# Patient Record
Sex: Female | Born: 1935 | Race: White | Hispanic: No | Smoking: Former smoker
Health system: Southern US, Community
[De-identification: ages and names within clinical notes are randomized; demographics above are authoritative.]

## PROBLEM LIST (undated history)

## (undated) DIAGNOSIS — F0390 Unspecified dementia without behavioral disturbance: Secondary | ICD-10-CM

## (undated) DIAGNOSIS — H3530 Unspecified macular degeneration: Secondary | ICD-10-CM

## (undated) DIAGNOSIS — E0590 Thyrotoxicosis, unspecified without thyrotoxic crisis or storm: Secondary | ICD-10-CM

## (undated) DIAGNOSIS — M1990 Unspecified osteoarthritis, unspecified site: Secondary | ICD-10-CM

## (undated) DIAGNOSIS — I10 Essential (primary) hypertension: Secondary | ICD-10-CM

## (undated) DIAGNOSIS — I639 Cerebral infarction, unspecified: Secondary | ICD-10-CM

## (undated) HISTORY — PX: APPENDECTOMY: SHX54

## (undated) HISTORY — PX: TONSILLECTOMY: SUR1361

---

## 2001-12-29 ENCOUNTER — Other Ambulatory Visit: Admission: RE | Admit: 2001-12-29 | Discharge: 2001-12-29 | Payer: Self-pay | Admitting: Dermatology

## 2004-05-07 ENCOUNTER — Inpatient Hospital Stay (HOSPITAL_COMMUNITY): Admission: EM | Admit: 2004-05-07 | Discharge: 2004-05-09 | Payer: Self-pay | Admitting: Emergency Medicine

## 2004-06-04 ENCOUNTER — Encounter (HOSPITAL_COMMUNITY): Admission: RE | Admit: 2004-06-04 | Discharge: 2004-06-05 | Payer: Self-pay | Admitting: Family Medicine

## 2005-11-19 ENCOUNTER — Ambulatory Visit (HOSPITAL_COMMUNITY): Admission: RE | Admit: 2005-11-19 | Discharge: 2005-11-19 | Payer: Self-pay | Admitting: Family Medicine

## 2006-06-25 ENCOUNTER — Ambulatory Visit (HOSPITAL_COMMUNITY): Admission: RE | Admit: 2006-06-25 | Discharge: 2006-06-25 | Payer: Self-pay | Admitting: Family Medicine

## 2006-08-04 IMAGING — CR DG CHEST 2V
2 series · 2 of 2 positions shown · non-contrast
Comparison: none

CLINICAL DATA: 87-year-old, acute CVA with dysphasia. 
 CHEST - TWO VIEW:
 Two views of the chest without prior study for comparison demonstrate the cardiac silhouette to be borderline enlarged. There is marked tortuosity and ectasia of the thoracic aorta.  There are probable chronic interstitial lung changes but no definite effusions, edema, or infiltrates.  Osteoporotic changes are noted in the spine. There are biapical pleural and parenchymal scaring-type changes, left greater than right.

[view not recorded (1 of 2)]
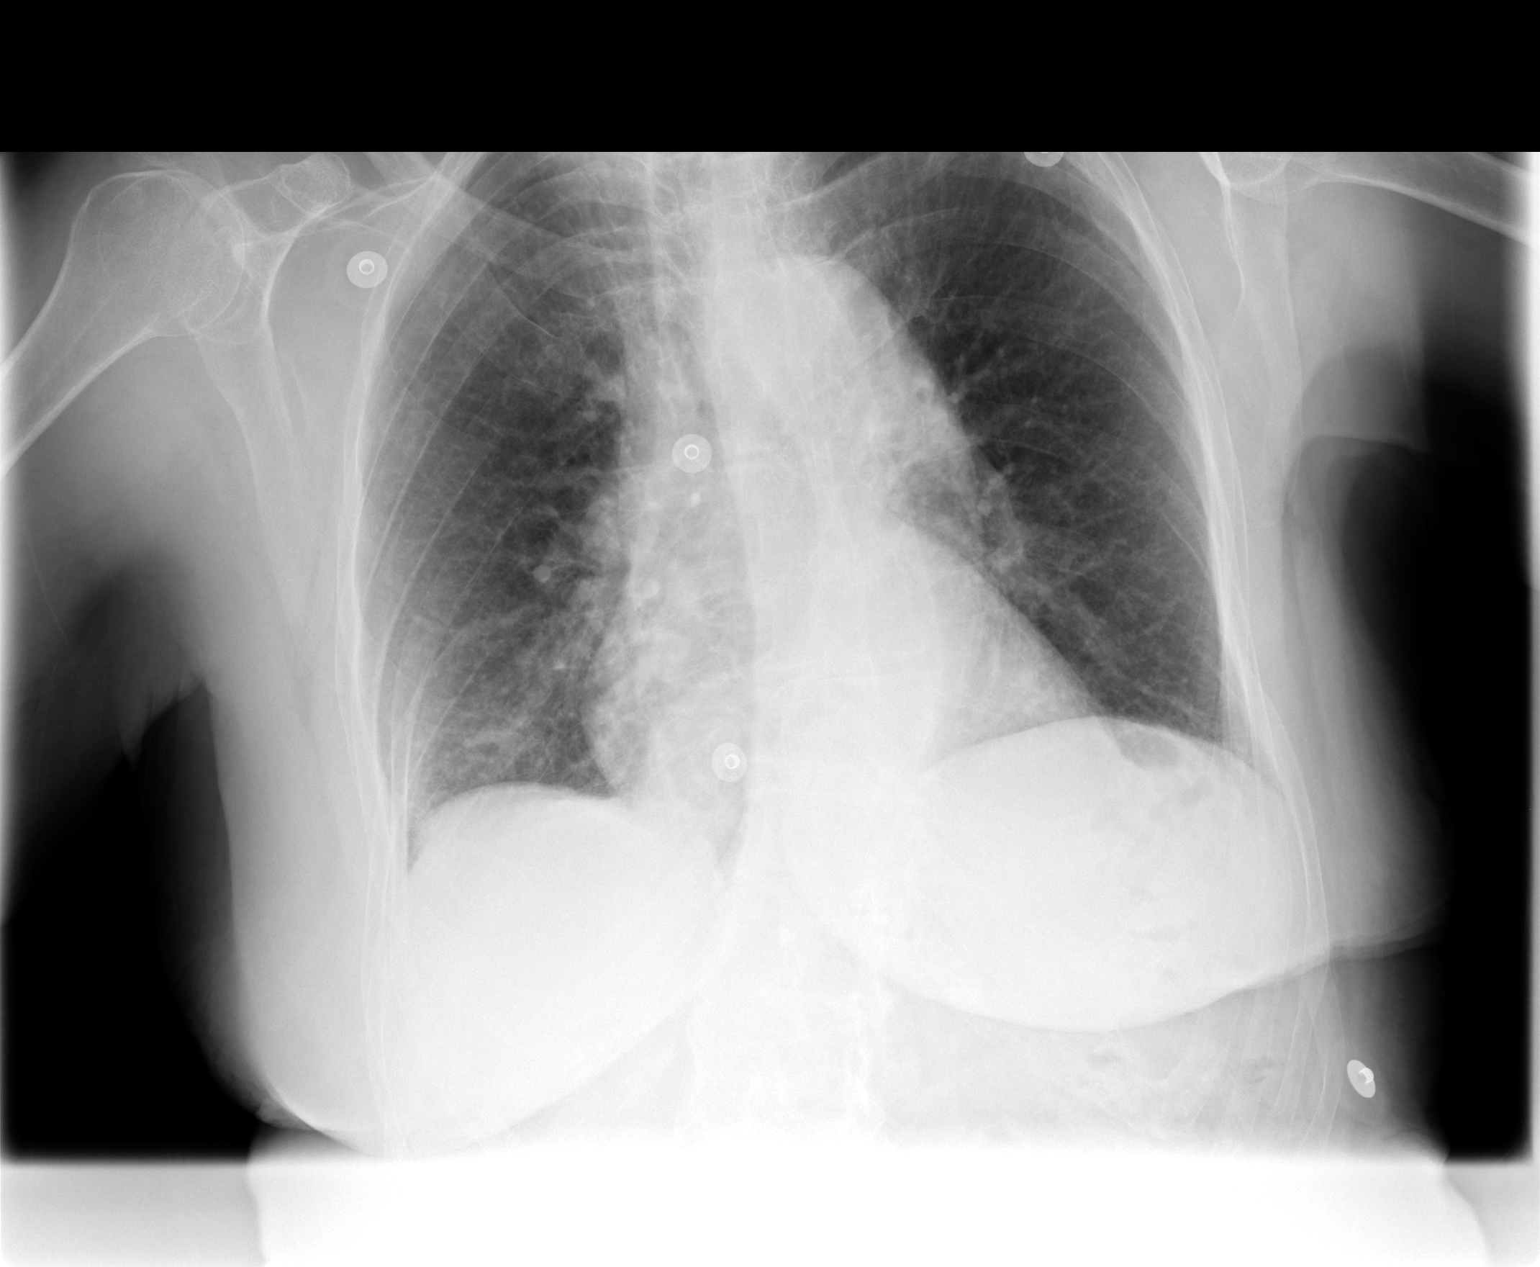

[view not recorded (2 of 2)]
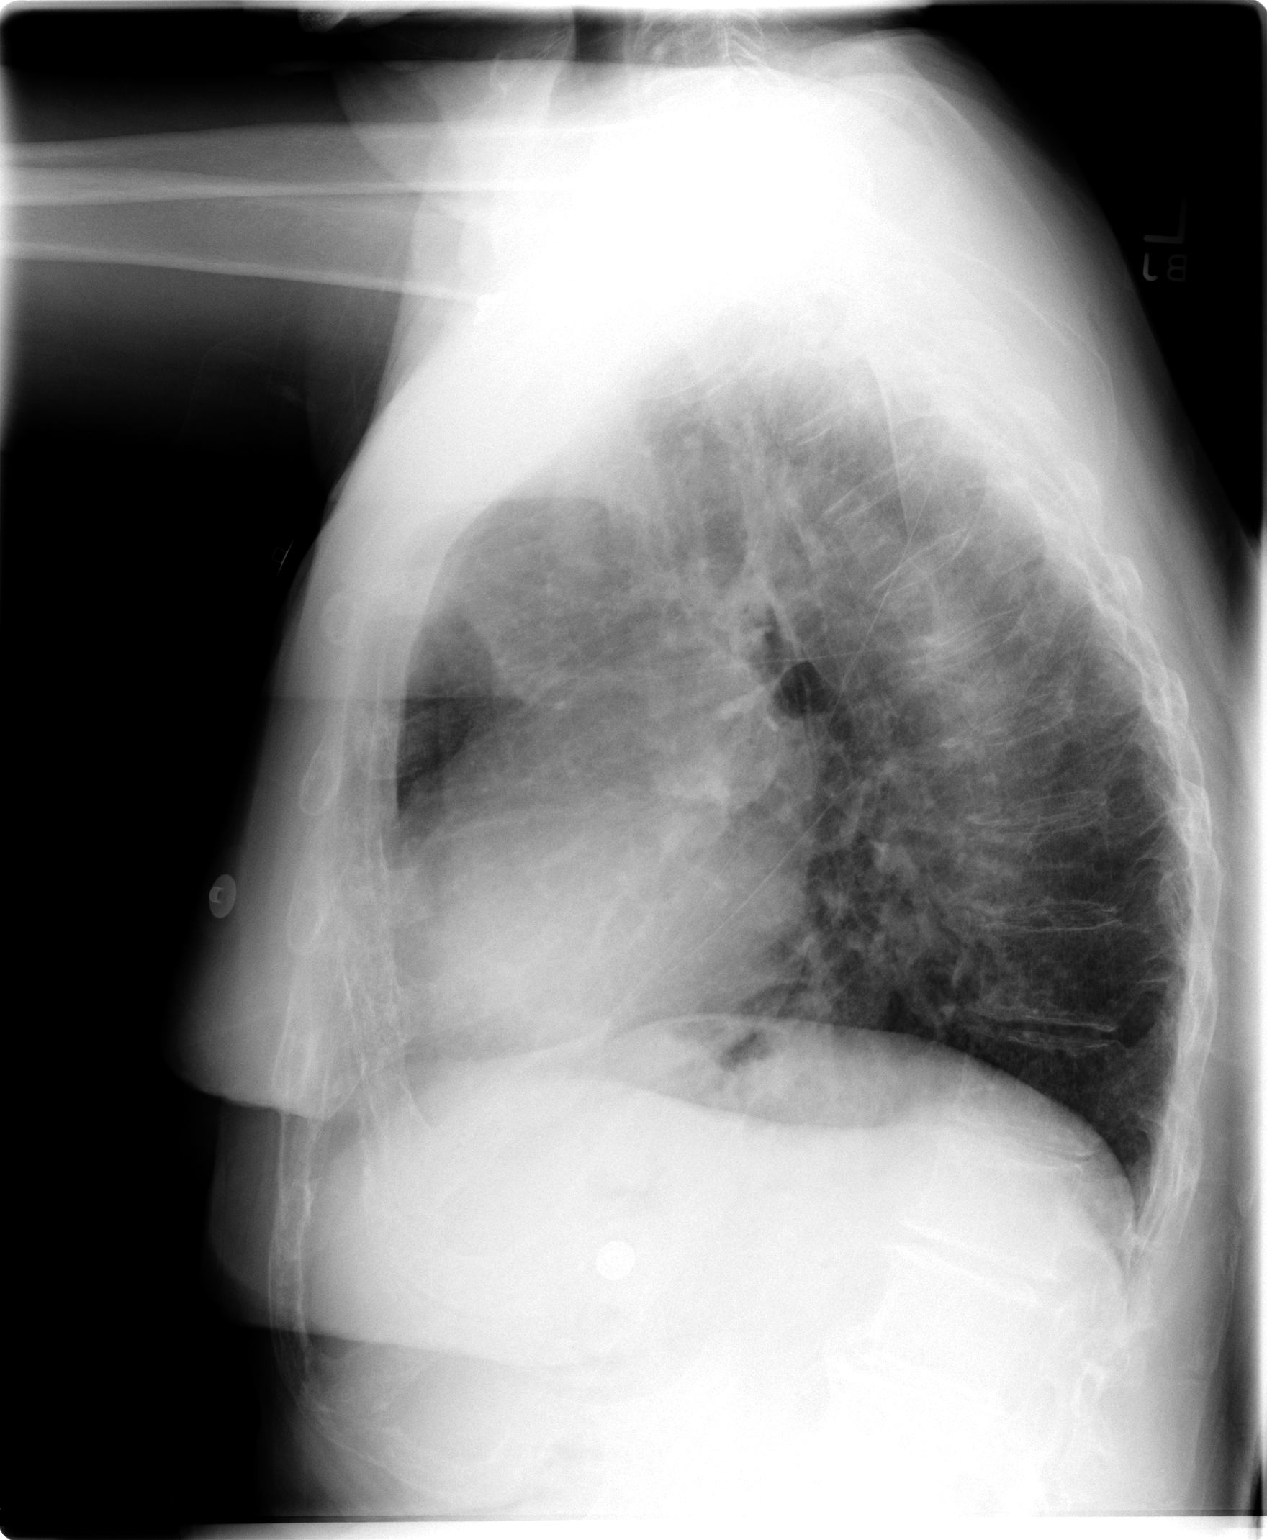

[2 of 2 positions shown; findings below may reference images not displayed]

IMPRESSION: 1.  Probable chronic lung changes.  Recommend correlation with any prior chest films.
 2.  Tortuous ectatic thoracic aorta.

## 2006-08-16 ENCOUNTER — Ambulatory Visit: Payer: Self-pay | Admitting: Family Medicine

## 2006-08-16 DIAGNOSIS — I10 Essential (primary) hypertension: Secondary | ICD-10-CM | POA: Insufficient documentation

## 2006-08-16 DIAGNOSIS — H3530 Unspecified macular degeneration: Secondary | ICD-10-CM | POA: Insufficient documentation

## 2006-08-16 DIAGNOSIS — H409 Unspecified glaucoma: Secondary | ICD-10-CM | POA: Insufficient documentation

## 2006-08-16 DIAGNOSIS — E785 Hyperlipidemia, unspecified: Secondary | ICD-10-CM

## 2006-08-16 DIAGNOSIS — Z8679 Personal history of other diseases of the circulatory system: Secondary | ICD-10-CM | POA: Insufficient documentation

## 2006-08-16 DIAGNOSIS — M1990 Unspecified osteoarthritis, unspecified site: Secondary | ICD-10-CM | POA: Insufficient documentation

## 2006-08-16 DIAGNOSIS — R443 Hallucinations, unspecified: Secondary | ICD-10-CM

## 2006-08-17 ENCOUNTER — Encounter (INDEPENDENT_AMBULATORY_CARE_PROVIDER_SITE_OTHER): Payer: Self-pay | Admitting: Family Medicine

## 2006-08-19 ENCOUNTER — Encounter (INDEPENDENT_AMBULATORY_CARE_PROVIDER_SITE_OTHER): Payer: Self-pay | Admitting: Family Medicine

## 2006-08-20 ENCOUNTER — Encounter (INDEPENDENT_AMBULATORY_CARE_PROVIDER_SITE_OTHER): Payer: Self-pay | Admitting: Family Medicine

## 2006-08-20 ENCOUNTER — Ambulatory Visit (HOSPITAL_COMMUNITY): Admission: RE | Admit: 2006-08-20 | Discharge: 2006-08-20 | Payer: Self-pay | Admitting: Family Medicine

## 2006-08-31 ENCOUNTER — Ambulatory Visit: Payer: Self-pay | Admitting: Family Medicine

## 2006-09-02 ENCOUNTER — Encounter (INDEPENDENT_AMBULATORY_CARE_PROVIDER_SITE_OTHER): Payer: Self-pay | Admitting: Family Medicine

## 2006-09-09 ENCOUNTER — Ambulatory Visit (HOSPITAL_COMMUNITY): Admission: RE | Admit: 2006-09-09 | Discharge: 2006-09-09 | Payer: Self-pay | Admitting: Family Medicine

## 2008-06-07 ENCOUNTER — Ambulatory Visit (HOSPITAL_COMMUNITY): Payer: Self-pay | Admitting: Family Medicine

## 2008-06-07 ENCOUNTER — Encounter (HOSPITAL_COMMUNITY): Admission: RE | Admit: 2008-06-07 | Discharge: 2008-06-20 | Payer: Self-pay | Admitting: Oncology

## 2008-08-13 ENCOUNTER — Emergency Department (HOSPITAL_COMMUNITY): Admission: EM | Admit: 2008-08-13 | Discharge: 2008-08-13 | Payer: Self-pay | Admitting: Emergency Medicine

## 2008-10-11 ENCOUNTER — Ambulatory Visit (HOSPITAL_COMMUNITY): Payer: Self-pay | Admitting: Family Medicine

## 2008-10-11 ENCOUNTER — Encounter (HOSPITAL_COMMUNITY): Admission: RE | Admit: 2008-10-11 | Discharge: 2008-11-10 | Payer: Self-pay | Admitting: Family Medicine

## 2010-01-22 ENCOUNTER — Inpatient Hospital Stay (HOSPITAL_COMMUNITY): Admission: EM | Admit: 2010-01-22 | Discharge: 2010-01-24 | Payer: Self-pay | Admitting: Emergency Medicine

## 2010-01-22 ENCOUNTER — Ambulatory Visit: Payer: Self-pay | Admitting: Orthopedic Surgery

## 2010-01-23 ENCOUNTER — Encounter: Payer: Self-pay | Admitting: Orthopedic Surgery

## 2010-01-27 ENCOUNTER — Telehealth: Payer: Self-pay | Admitting: Orthopedic Surgery

## 2010-01-27 ENCOUNTER — Encounter: Payer: Self-pay | Admitting: Orthopedic Surgery

## 2010-01-30 ENCOUNTER — Ambulatory Visit: Payer: Self-pay | Admitting: Orthopedic Surgery

## 2010-01-30 ENCOUNTER — Ambulatory Visit (HOSPITAL_COMMUNITY): Admission: RE | Admit: 2010-01-30 | Discharge: 2010-01-30 | Payer: Self-pay | Admitting: Orthopedic Surgery

## 2010-02-04 ENCOUNTER — Telehealth: Payer: Self-pay | Admitting: Orthopedic Surgery

## 2010-02-04 ENCOUNTER — Encounter: Payer: Self-pay | Admitting: Orthopedic Surgery

## 2010-02-06 ENCOUNTER — Ambulatory Visit: Payer: Self-pay | Admitting: Orthopedic Surgery

## 2010-02-06 ENCOUNTER — Ambulatory Visit (HOSPITAL_COMMUNITY): Admission: RE | Admit: 2010-02-06 | Discharge: 2010-02-06 | Payer: Self-pay | Admitting: Orthopedic Surgery

## 2010-02-07 ENCOUNTER — Inpatient Hospital Stay (HOSPITAL_COMMUNITY): Admission: RE | Admit: 2010-02-07 | Discharge: 2010-02-12 | Payer: Self-pay | Admitting: Orthopedic Surgery

## 2010-02-07 ENCOUNTER — Ambulatory Visit: Payer: Self-pay | Admitting: Orthopedic Surgery

## 2010-02-08 ENCOUNTER — Encounter: Payer: Self-pay | Admitting: Orthopedic Surgery

## 2010-02-11 ENCOUNTER — Ambulatory Visit: Payer: Self-pay | Admitting: Orthopedic Surgery

## 2010-02-20 ENCOUNTER — Ambulatory Visit: Payer: Self-pay | Admitting: Orthopedic Surgery

## 2010-02-20 ENCOUNTER — Ambulatory Visit (HOSPITAL_COMMUNITY): Admission: RE | Admit: 2010-02-20 | Discharge: 2010-02-20 | Payer: Self-pay | Admitting: Orthopedic Surgery

## 2010-03-18 ENCOUNTER — Encounter: Payer: Self-pay | Admitting: Orthopedic Surgery

## 2010-03-20 ENCOUNTER — Ambulatory Visit (HOSPITAL_COMMUNITY): Admission: RE | Admit: 2010-03-20 | Discharge: 2010-03-20 | Payer: Self-pay | Admitting: Orthopedic Surgery

## 2010-03-20 ENCOUNTER — Ambulatory Visit: Payer: Self-pay | Admitting: Orthopedic Surgery

## 2010-03-20 DIAGNOSIS — S82843A Displaced bimalleolar fracture of unspecified lower leg, initial encounter for closed fracture: Secondary | ICD-10-CM

## 2010-04-10 ENCOUNTER — Ambulatory Visit: Payer: Self-pay | Admitting: Orthopedic Surgery

## 2010-05-08 ENCOUNTER — Ambulatory Visit: Payer: Self-pay | Admitting: Orthopedic Surgery

## 2010-06-25 ENCOUNTER — Ambulatory Visit
Admission: RE | Admit: 2010-06-25 | Discharge: 2010-06-25 | Payer: Self-pay | Source: Home / Self Care | Attending: Orthopedic Surgery | Admitting: Orthopedic Surgery

## 2010-07-12 ENCOUNTER — Encounter: Payer: Self-pay | Admitting: Family Medicine

## 2010-07-22 NOTE — Letter (Signed)
Summary: Xray order  Xray order   Imported By: Cammie Sickle 03/18/2010 10:31:58  _____________________________________________________________________  External Attachment:    Type:   Image     Comment:   External Document

## 2010-07-22 NOTE — Progress Notes (Signed)
Summary: call from nursing facility/Post op 1 appt request@Short  Stay  Phone Note Other Incoming   Caller: nursing facility Summary of Call: Britta Mccreedy Ridelin from [facility] in [place], [place] [phone] called to schedule for Thursday, 01/30/10, per Dr Romeo Apple, for post op dressing change and cast or splint, to be seen at [facility]. Please advise of time, prior to or following clinic Initial call taken by: Cammie Sickle,  January 27, 2010 9:51 AM

## 2010-07-22 NOTE — Letter (Signed)
Summary: Short Stay physician's orders  Short Stay physician's orders   Imported By: Jacklynn Ganong 01/30/2010 08:45:57  _____________________________________________________________________  External Attachment:    Type:   Image     Comment:   External Document

## 2010-07-22 NOTE — Consult Note (Signed)
Summary: [facility]   Imported By: Cammie Sickle 01/27/2010 09:21:26  _____________________________________________________________________  External Attachment:    Type:   Image     Comment:   External Document

## 2010-07-22 NOTE — Progress Notes (Signed)
Summary: when to see Riccardo Dubin  Phone Note Other Incoming   Summary of Call: Steward Drone, the administrator at [facility] called asking if you are going to see Trena Dunavan at the hospital this coming Friday (02/07/10) and if so what time. Steward Drone can be reached at [phone] Initial call taken by: Jacklynn Ganong,  February 04, 2010 3:02 PM

## 2010-07-22 NOTE — Assessment & Plan Note (Signed)
Summary: HOSP FOL/UP/FX ANKLE/XRAY PRIOR TO [facility]PT,[facility]/POST OP/MEDICARE/CAF   Visit Type:  Follow-up Primary Provider:  Franchot Heidelberg, MD  CC:  post op right ankle.  History of Present Illness:   DOS 01/24/10 short leg splint, evacuation of subcutaneous blisters right ankle fracture,   then OTIF 02/07/10  Today is recheck.  Xrays were taken [facility] 03/20/10 of the right ankle in cast.  She is only able to maintain weightbearing restriction which is no weight-bearing part of the time she does play some weight on her heel  She has an external rotation deformity chronic  X-rays done at the hospital.  Overall alignment acceptable.  Fracture slight gapping noted on extreme oblique view  Things seem to be holding up well  Skin is much improved with no blisters all incisions healed  Recommend continue nonweightbearing in a short leg cast and come back in 3 weeks for x-rays out of plaster here.  Allergies: 1)  ! Codeine   Impression & Recommendations:  Problem # 1:  AFTERCARE HEALING TRAUMATIC FRACTURE OTHER BONE (ICD-V54.19)  Orders: Post-Op Check (16109)  Problem # 2:  CLOSED BIMALLEOLAR FRACTURE (ICD-824.4)  Orders: Post-Op Check (60454)  Patient Instructions: 1)  Please schedule a follow-up appointment in 3 weeks. 2)  xrays OOP HERE

## 2010-07-22 NOTE — Letter (Signed)
Summary: Short stay physician's orders  Short stay physician's orders   Imported By: Jacklynn Ganong 02/04/2010 16:51:25  _____________________________________________________________________  External Attachment:    Type:   Image     Comment:   External Document

## 2010-07-22 NOTE — Progress Notes (Signed)
Summary: application of splint at [facility] 02/06/10 12pm  Phone Note Outgoing Call   Summary of Call: called and let facility know that pt needs to be at [facility] this Thursday the 18th at 1130am for splint application, skin check.   Initial call taken by: Ether Griffins,  February 04, 2010 4:24 PM

## 2010-07-22 NOTE — Letter (Signed)
Summary: Patient medication discharge instrauctions  Patient medication discharge instrauctions   Imported By: Jacklynn Ganong 02/13/2010 15:21:18  _____________________________________________________________________  External Attachment:    Type:   Image     Comment:   External Document

## 2010-07-22 NOTE — Progress Notes (Signed)
Summary: nursing facility called about drainage   Phone Note Call from Patient   Summary of Call: Britta Mccreedy from [facility] [phone]) called back  to relay that therapist from [facility] came to see patient and notes that surgical site has drainage on both sides of ankle -- said looks dry on LT side and fresh on RT side of ankle.  Please advise if home health nurse to be orderedr for wound care?  States due to be seen by Dr Danella Penton 01/30/10.  If home nurse needed, they use [facility], [place] ph [phone] Initial call taken by: Cammie Sickle,  January 27, 2010 2:36 PM  Follow-up for Phone Call        no wound care  Follow-up by: Fuller Canada MD,  January 27, 2010 2:37 PM  Additional Follow-up for Phone Call Additional follow up Details #1::        Advised, phone call completed Additional Follow-up by: Cammie Sickle,  January 27, 2010 3:03 PM

## 2010-07-22 NOTE — Letter (Signed)
Summary: Hospital progress note  Hospital progress note   Imported By: Cammie Sickle 02/25/2010 10:54:17  _____________________________________________________________________  External Attachment:    Type:   Image     Comment:   External Document

## 2010-07-22 NOTE — Assessment & Plan Note (Signed)
Summary: 1 M RE-CK/XRAYS RT ANKLE/POST OP/MEDICARE/CAF   Visit Type:  Follow-up Primary Provider:  Franchot Heidelberg, MD  CC:  fx care post op.  History of Present Illness: I saw Gabrielle Goodwin in the office today for a 3 week  followup visit.  She is a 77 years old woman with the complaint of:  right ankle fracture  DOS 01/24/10 short leg splint, evacuation of subcutaneous blisters right ankle fracture   OTIF 02/07/10  Today is recheck with xrays OOP.  No pain med needed.  Wound to finally healed.  Her ankle is in neutral position in terms of the joint.  She has no tenderness. No deformity. No pain no swelling.  X-rays 3 views of the RIGHT ankle, show that the fracture has maintained its wound and the hardware is intact. There was a separation of the fracture at the medial malleolus. This persisted throughout treatment. No change of position. Mortise is intact.   Aircast for 6 weeks repeat x-rays  Allergies: 1)  ! Codeine   Impression & Recommendations:  Problem # 1:  CLOSED BIMALLEOLAR FRACTURE (ICD-824.4) Assessment Improved  Orders: Post-Op Check (17616) Ankle x-ray complete,  minimum 3 views (07371)  Problem # 2:  AFTERCARE HEALING TRAUMATIC FRACTURE OTHER BONE (ICD-V54.19) Assessment: Improved  Orders: Post-Op Check (06269) Ankle x-ray complete,  minimum 3 views (48546)  Patient Instructions: 1)  x-rays in 6 weeks   Orders Added: 1)  Post-Op Check [99024] 2)  Ankle x-ray complete,  minimum 3 views [27035]

## 2010-07-22 NOTE — Assessment & Plan Note (Signed)
Summary: 3 WK RE-CK/XRAYS OOP RT ANKLE/POST OP/MEDICARE/CAF   Visit Type:  Follow-up Primary Provider:  Franchot Heidelberg, MD  CC:  right ankle fracture.  History of Present Illness: I saw Gabrielle Goodwin in the office today for a 3 week  followup visit.  She is a 77 years old woman with the complaint of:  right ankle fracture  DOS 01/24/10 short leg splint, evacuation of subcutaneous blisters right ankle fracture,   then OTIF 02/07/10  Today is recheck with xrays OOP.  No pain.  Doing well, in wheelchair today  Exam the patient has been somewhat noncompliant with the nonweightbearing status but she comes in today with skin intact.  Cast is changed.  X-rays show that there is some gapping at the fracture site but the hardware is intact the overall mortise is acceptable  Impression healing ankle fracture with some fracture gapping but overall acceptable position  Recommend cast for another 4 weeks and x-ray out of plaster  Allergies: 1)  ! Codeine   Other Orders: Post-Op Check (53664) Ankle x-ray complete,  minimum 3 views (40347)   Orders Added: 1)  Post-Op Check [99024] 2)  Ankle x-ray complete,  minimum 3 views [42595]

## 2010-07-22 NOTE — Progress Notes (Signed)
Summary: Call to [facility]  ---- Converted from flag ---- ---- 01/27/2010 4:11 PM, Fuller Canada MD wrote: call them back  cast application thurs 8-11  be at hospital at 8 am ------------------------------  Phone Note Outgoing Call   Summary of Call: I called Britta Mccreedy at nursing facility [phone] & advised per above.

## 2010-07-24 NOTE — Assessment & Plan Note (Signed)
Summary: 6 WK RE-CK/XRAY RT ANKLE /POST OP/MEDICARE/[facility]   Visit Type:  Follow-up Primary Provider:     CC:  right ankle fracture.  History of Present Illness: I saw Gabrielle Goodwin in the office today for a followup visit.  She is a 77 years old woman with the complaint of:    8-5-2011DOS   Xrays today.  ZO:XWRUE ankle fracture  Treatment:OTIF   Today, scheduled AVW:UJWJX ANKLE   3 views of the RIGHT ankle. There is internal fixation, medial and lateral mortise appears to be intact. Fractures appear to be healed. Alignment looks normal.  Impression status post open treatment internal fixation, RIGHT ankle   Allergies: 1)  ! Codeine   Impression & Recommendations:  Problem # 1:  CLOSED BIMALLEOLAR FRACTURE (ICD-824.4)  Orders: Est. Patient Level II (91478) Ankle x-ray complete,  minimum 3 views (29562)  Problem # 2:  AFTERCARE HEALING TRAUMATIC FRACTURE OTHER BONE (ICD-V54.19)  Orders: Est. Patient Level II (13086) Ankle x-ray complete,  minimum 3 views (57846)  Patient Instructions: 1)  Please schedule a follow-up appointment as needed.   Orders Added: 1)  Est. Patient Level II [96295] 2)  Ankle x-ray complete,  minimum 3 views [73610]

## 2010-09-04 LAB — GLUCOSE, CAPILLARY: Glucose-Capillary: 222 mg/dL — ABNORMAL HIGH (ref 70–99)

## 2010-09-05 LAB — DIFFERENTIAL
Basophils Absolute: 0 10*3/uL (ref 0.0–0.1)
Basophils Relative: 0 % (ref 0–1)
Eosinophils Absolute: 0.1 10*3/uL (ref 0.0–0.7)
Eosinophils Relative: 1 % (ref 0–5)
Monocytes Absolute: 0.4 10*3/uL (ref 0.1–1.0)
Monocytes Relative: 6 % (ref 3–12)
Neutro Abs: 5.8 10*3/uL (ref 1.7–7.7)

## 2010-09-05 LAB — BASIC METABOLIC PANEL
BUN: 19 mg/dL (ref 6–23)
CO2: 26 mEq/L (ref 19–32)
GFR calc non Af Amer: 38 mL/min — ABNORMAL LOW (ref >60)
Glucose, Bld: 121 mg/dL — ABNORMAL HIGH (ref 70–99)
Potassium: 4.1 mEq/L (ref 3.5–5.1)

## 2010-09-05 LAB — POCT I-STAT, CHEM 8
Calcium, Ion: 0.99 mmol/L — ABNORMAL LOW (ref 1.12–1.32)
Chloride: 109 mEq/L (ref 96–112)
Creatinine, Ser: 1.4 mg/dL — ABNORMAL HIGH (ref 0.4–1.2)
Glucose, Bld: 145 mg/dL — ABNORMAL HIGH (ref 70–99)
HCT: 39 % (ref 36.0–46.0)

## 2010-09-05 LAB — CBC
HCT: 33.5 % — ABNORMAL LOW (ref 36.0–46.0)
Hemoglobin: 11.3 g/dL — ABNORMAL LOW (ref 12.0–15.0)
MCH: 31.4 pg (ref 26.0–34.0)
MCHC: 33.7 g/dL (ref 30.0–36.0)
MCV: 93.2 fL (ref 78.0–100.0)
RDW: 13.7 % (ref 11.5–15.5)

## 2010-09-22 ENCOUNTER — Other Ambulatory Visit: Payer: Self-pay | Admitting: Family Medicine

## 2010-09-29 ENCOUNTER — Other Ambulatory Visit: Payer: Self-pay | Admitting: Family Medicine

## 2010-10-01 LAB — CROSSMATCH
ABO/RH(D): O POS
Antibody Screen: NEGATIVE

## 2010-11-07 NOTE — H&P (Signed)
Gabrielle Goodwin, Gabrielle Goodwin               ACCOUNT NO.:  [id number]   MEDICAL RECORD NO.:  [id number]          PATIENT TYPE:  INP   LOCATION:  A214                          FACILITY:  [facility]   PHYSICIAN:  Kofi A. Gerilyn Pilgrim, M.D. DATE OF BIRTH:  [birth year]   DATE OF ADMISSION:  05/07/2004  DATE OF DISCHARGE:  LH                                HISTORY & PHYSICAL   ASSESSMENT/PLAN:  Acute cerebrovascular event, likely in a subcortical area.  She currently is on deep vein thrombosis prophylaxis and aspirin and anti-  platelet agents. We believe that these regimens are appropriate. She also  has imaging of the brain with MRI and MRA pending. She also has  echocardiography and Doppler's ordered. Pending these results, will be  followed up.   HISTORY OF PRESENT ILLNESS:  An elderly 77 year old lady who apparently has  a baseline history of hypertension. She was noted to have relatively acute  onset of slurred speech. This was noted by her daughter on the phone who was  talking to her at approximately 2:00 o'clock today. When EMS arrived, they  apparently noted some left-sided weakness, although this is somewhat  disputable. The patient was seen in the emergency room and was found to have  elevated blood pressure of 260/64. She apparently has a blood pressure that  is easily controlled in 120's over 80. No headaches or chest pain are  reported. No shortness of breath, fevers.   PAST MEDICAL HISTORY:  Macular degeneration, basal cell carcinoma of the  face and neck, hypertension, osteoarthritis.   MEDICATIONS:  Advil.   ALLERGIES:  None known.   SOCIAL HISTORY:  She lives alone. She is a retired Scientist, product/process development. She quit  tobacco 20 years ago. Previous to that, she used it for a long time. No  reports of tobacco or illicit drug use.   FAMILY HISTORY:  Significant for myocardial infarction and leukemia.   REVIEW OF SYSTEMS:  Listed in the history of present illness. All others  unremarkable.  There are several reports of left knee pain.   PHYSICAL EXAMINATION:  GENERAL:  Examination shows a thin, pleasant lady in  no acute distress. She is somewhat agitated about being in the bed and not  being positioned properly. We did get the nurse and get that taken care of.  VITAL SIGNS:  Temperature 97.1, blood pressure 116/74, pulse in the 70's,  respiratory rate 20.  HEENT:  Unremarkable evaluation.  NECK:  Supple.  LUNGS:  Clear to auscultation bilaterally.  CARDIOVASCULAR:  Normal S1 and S2.  ABDOMEN:  Soft.  EXTREMITIES:  No edema.  NEUROLOGIC:  She is awake and alert. She was initially somewhat stunned on  the initial part of the encounter, but she was easily oriented and  appropriate. She does have a clear mild dysarthria. Naming is unremarkable.  Comprehension fluency and repetition are intact. Cranial nerves 2-12 are  intact including visual fields. Motor examination shows normal tone, bulk  and strength. There is no pronator drift. Coordination is intact. Reflexes  are +2. Plantar reflexes are upgoing on the right and downgoing on  the left.  Sensory examination normal to temperature and light touch.   LABORATORY DATA:  Electrocardiogram shows normal sinus rhythm.   Head CT is negative for any acute process.     Kofi   KAD/MEDQ  D:  05/07/2004  T:  05/07/2004  Job:  [id number]

## 2010-11-07 NOTE — Procedures (Signed)
Gabrielle Goodwin, Gabrielle Goodwin               ACCOUNT NO.:  [id number]   MEDICAL RECORD NO.:  [id number]          PATIENT TYPE:  INP   LOCATION:  A214                          FACILITY:  [facility]   PHYSICIAN:  Dani Gobble, MD       DATE OF BIRTH:  Oct 12, 1916   DATE OF PROCEDURE:  1August 31, 202005  DATE OF DISCHARGE:                                  ECHOCARDIOGRAM   REFERRING PHYSICIAN:  Dr. Vania Rea.   INDICATIONS:  Gabrielle Goodwin is an 77 year old female with a stroke who was  found to have a murmur.   TECHNICAL QUALITY:  The technical quality of the study is somewhat limited  secondary to patient body habitus and poor acoustic windows.   FINDINGS:  1.  The aorta is within normal limits and 3.1 cm.  2.  The left atrium is moderately dilated at 4.9 cm.  The patient appeared      to be in sinus rhythm during the procedure.  No obvious clots or masses      were appreciated on this study, although it would be somewhat difficult      to discern a small mass or clot given the technical limitations on this      study.  3.  The intraventricular septum and posterior wall are both mild-to-      moderately thickened at 1.5 cm for each.  4.  The aortic valve was not well visualized, but is probably a trileaflet      valve.  There was mild thickening and calcification on the leaflets.      Opening leaflet excursion is reasonable.  There is mild aortic      insufficiency noted.  Doppler interrogation of the aortic valve reveals      a peak velocity of 1.6 meters per second, corresponding to a peak      gradient of 11 mmHg and a mean gradient of 7 mmHg.  5.  The mitral valve is also mildly thickened, but with normal leaflet      excursion.  No mitral valve prolapse is noted.  Mild, mitral annular      calcification is noted.  Mild mitral regurgitation is noted.  Doppler      interrogation of the mitral valve is within normal limits.  6.  The pulmonic valve is not well visualized.  7.  The tricuspid valve  appears grossly structurally normal with mild      tricuspid regurgitation noted.  Pulmonary pressures are estimated at      approximately 35 mmHg representing mild pulmonary hypertension.  8.  The left ventricle appears grossly normal in size.  Left ventricular      systolic function appears normal.  No obvious regional wall motion      abnormalities are noted.  The presence of diastolic dysfunction is      inferred from pulse wave Doppler across the mitral valve.  The right      ventricle appears normal in size with regional right ventricular      systolic pressure.  The right atrium is mild to moderately dilated.  There is a Chiari network noted in the right atrium (normal variant).   IMPRESSION:  1.  Mild to moderate biatrial enlargement.  2.  Mild to moderate concentric left ventricular hypertrophy.  3.  Mild aortic sclerosis without stenosis.  4.  Mild mitral annular calcification.  5.  Mild mitral and tricuspid regurgitation.  6.  Mild aortic insufficiency.  7.  Normal left ventricular size and systolic function without regional wall      motion abnormality noted.  8.  Chiari network is noted within the right atrium which is a normal      variant.  9.  The presence of diastolic dysfunction is inferred from pulse wave      Doppler across the mitral valve.      AB/MEDQ  D:  109/06/2003  T:  05/09/2004  Job:  [id number]   cc:   Vania Rea, M.D.

## 2010-11-07 NOTE — Group Therapy Note (Signed)
NAMEXIANNA, SIVERLING               ACCOUNT NO.:  [id number]   MEDICAL RECORD NO.:  [id number]          PATIENT TYPE:  INP   LOCATION:  A214                          FACILITY:  [facility]   PHYSICIAN:  Kofi A. Gerilyn Pilgrim, M.D. DATE OF BIRTH:  09/16/1916   DATE OF PROCEDURE:  05/09/2004  DATE OF DISCHARGE:                                   PROGRESS NOTE   The family of the patient reports that she has had episodic pulling of the  face towards the left.  Examination on admission was unremarkable other than  upgoing toe on the right. Today's examination shows a clear flattening of  the nasolabial fold on the right.  Her speech continues to be dysarthric as  it was on admission.  The patient complains of weakness on the right side,  though no clear weakness was elicited.   Her workup has been reviewed.  She has low B12 of 111.  Homocysteine level  was high at 21.   Echocardiography shows concentric left ventricular hypertrophy but normal  ejection size and function.  The current duplex Doppler shows atheroma  involving the left internal carotid artery with percent of stenosis probably  just over 50% with a velocity of 146.   The MRA was reviewed extensively.  She has evidence of multiple small acute  infarcts seen on diffusion-weighted sequences, cortically based lesions  involving the left posterior MCA distribution, essentially the left parietal  temporal region.  She also has some chronic ischemic paraventricular white  matter lesions seen bilaterally.   The MRA of intracranial vessels shows unremarkable posterior circulation.  The anterior circulation shows about a 50% stenosis involving the M1  distribution of the right MCA.  The left MCA shows some moderate  athosclerotic disease diffusely distally.   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   I do not believe her lesions warrant any  surgical intervention.   I agree with the current plan which is to maximize medical treatment.  The  patient was not on any form of treatment plan before.  She currently has  been placed on aspirin, Lipitor, blood pressure control, and aggressive B12  replacement.  She will follow up with [place] in about two weeks and with her  primary care physician.     Kofi   KAD/MEDQ  D:  05/09/2004  T:  05/09/2004  Job:  [id number]

## 2010-11-07 NOTE — Discharge Summary (Signed)
Gabrielle Goodwin, BRISSETT NO.:  [id number]   MEDICAL RECORD NO.:  [id number]          PATIENT TYPE:  INP   LOCATION:  A214                          FACILITY:  [facility]   PHYSICIAN:  Vania Rea, M.D. DATE OF BIRTH:  [birth year]   DATE OF ADMISSION:  05/07/2004  DATE OF DISCHARGE:  11/18/2005LH                                 DISCHARGE SUMMARY   PRIMARY CARE PHYSICIAN:  Unassigned, now assigned to Dr. Fara Chute in  [place].   DISCHARGE DIAGNOSES:  1.  Acute cerebrovascular accident.  2.  Hypertension, improved control.  3.  B-12 deficiency.  4.  Abnormal thyroid function test.  5.  Elevated alkaline phosphatase of unclear etiology.   DISPOSITION:  Discharged to live with her daughter.   DISCHARGE CONDITION:  Stable.   DISCHARGE MEDICATIONS:  1.  Lopressor 50 mg twice daily.  2.  Avapro 150 mg twice daily.  3.  Aspirin 325 mg daily.  4.  Cyanacobalamine  1,000 mcg intramuscularly weekly x4 doses then 1,000      mcg intramuscularly monthly.   HOSPITAL COURSE:  Please refer to admission history and physical dictated  May 07, 2004.  This is an 77 year old, hearing impaired, Caucasian lady  who has not been to a primary care physician for many years, who lives alone  and was in her baseline state of health until she noticed she had difficulty  speaking and weakness on her left side.  Four hours later she called her  daughter who had difficulty understanding her over the phone and summoned  emergency medical services . In the ED she was found to be clearly  dysarthric, had variable facial and upper extremity weakness.  A CT scan was  negative.  She was admitted with a idea of a possible stroke.  The patient underwent a stroke workup, had an MRI and MRA which gave the  results described below suggestive of an acute ischemic event, probably  microembolic.  The patient's blood work also showed evidence of a B-12  deficiency with elevated homocystinemia;  methylmelanic acid level is  pending.  The patient's blood work also showed evidence of a markedly  suppressed TSH and thyroid function.  Free T4 upper limits of normal.  The  patient did not have overt signs of hyperthyroidism.  Repeat TSH and free T3  are pending.  The patient had evaluation by speech therapy, and they found  there was limited impairment of her swallowing.  They recommend a regular  diet with sips of liquid.  The patient's speech has improved throughout her  hospital stay, but there is still mild dysarthria.  During the hospital stay, the patient seemed to be having stuttering stroke  with episodic numbness of the right upper extremity and episodic weakness of  the right side of the face and sometimes left side of the face.  The patient  had the benefit of evaluation by the neurologist, Dr. Gerilyn Pilgrim, during this  admission.   PHYSICAL EXAMINATION:  GENERAL:  Today,  the patient is alert and oriented x  3 and eager to be discharged home.  She ambulates the halls of the hospital  without difficulty.  VITAL SIGNS:  Her vitals at the time of discharge, her temperature is 97,  pulse 70, respirations 20, blood pressure 143/88.  On admission her blood  pressure was 206/64 to 168/74.  Her weight was recorded this morning as  145.3 pounds.  CHEST:  She has mild bibasilar crackles.  She has been receiving IV fluids.  This has been discontinued.  CARDIOVASCULAR:  She has an S4 gallop.  No murmurs.  Her cardiovascular  system at the time of examination, she had a grade 5 power in all limbs, but  she does have arthritis of the fingers of bones of the hands.  ABDOMEN:  Soft and nontender.  EXTREMITIES:  Without edema.   LABS:  Her white count is 5.5. Her hemoglobin is 11.6, hematocrit 34.0.  Her  red cell count is slightly depressed at 3.86.  Her platelets 230.  She has a  normal differential on her white count.  Her sodium is 144, potassium 3.9,  chloride 115, CO2 24, glucose  91, BUN 12, creatinine 0.9, calcium 9.0.  Her  total bilirubin is, last time it was checked, was 0.4.  Her alk phos was  139, AST 15, ALT 11, total protein 5.8, albumin 3.3, and calcium 8.7.  Her  free T4 was 1.78.  The upper limits of normal is 1.80.  Her TSH was 0.005.  The lower limits of normal is 0.35.  Her vitamin B-12 was 112.  The low  limit of normal is 200.  Her folate was normal at 13.7.  Her homocystine was  elevated at 21.48.  Urinalysis was bland.  Her RPR was nonreactive.   An echocardiogram was done and this revealed mild bi-atrial enlargement,  mild to moderate concentric left ventricular hypertrophy, mild aortic  sclerosis without stenosis, mild mitral anular calcification, mild mitral  and tricuspid regurgitation, mild aortic insufficiency, normal left  ventricular size and systolic function without regional wall motion  abnormality, diastolic dysfunction.   The patient had bilateral carotid duplex ultrasound which revealed left  internal carotid artery origin plaque resulting in 41-59% diameter stenosis.  No significant lesions on the right.  Abdominal ultrasound revealed no  evidence of gallstones or biliary dilation.  No liver mass.  Small right  renal cyst.  No hydronephrosis.  Increased renal parenchymal echo-genesis  suspicious for medical-renal disease.   The patient had MRI/MRA of the brain with and without contrast which  revealed multiple focal areas in the left anterior parietal region  consistent with recent ischemic insults, possibly microembolic.  Mild  extensive small vessel type super tentorial lesions possibly related to  hypertension.  Physiologic calcification in the basal ganglia more prominent  on the left side.  Mild sinusitis in the ethmoid and frontal sinuses with  mucus retention cyst in the left maxillary sinus.  The patient also had 50- 75% stenosis of the right middle cerebral artery in the M1 segment.  Truncated flow in the inferior  division of the left MCA, highly suspicious  for occlusion.  Mild to moderate arteriosclerotic changes involving the  posterior cerebral arteries bilaterally.  No evidence of aneurysms.   FOLLOW UP:  1.  The patient has chosen to be followed by Dr. Neita Carp in [place].  2.  Patient is also to be followed up by Dr. Gerilyn Pilgrim a neurologist.  3.  The patient is to have home health and home rehab.  4.  She will be staying with  her daughter.   SPECIAL INSTRUCTIONS:  1.  Particular items that need followup:      1.  A methylmalonic acid level.      2.  Repeat TSH and a free T4.  The patient may need an endocrine          evaluation and may need a thyroid evaluation.  2.  At this age, it may be simpler to treat the patient empirically for B-12      deficiency rather than to do a pernicious anemia workup.  3.  The patient has an elevated alk phos, no liver cause has been      identified.  The patient may need a bone scan or skeletal survey in case      of bony abnormality, although the elevation of the alk phos is rather      mild.   Addendum: repeat TSH is <0.004; will probably need endocrine f/u at  discretion of PCP.     Leop   LC/MEDQ  D:  05/09/2004  T:  05/09/2004  Job:  [id number]

## 2010-11-07 NOTE — Group Therapy Note (Signed)
Gabrielle Goodwin, HERSH NO.:  [id number]   MEDICAL RECORD NO.:  [id number]          PATIENT TYPE:  INP   LOCATION:  A214                          FACILITY:  [facility]   PHYSICIAN:  Vania Rea, M.D. DATE OF BIRTH:  11/23/1916   DATE OF PROCEDURE:  DATE OF DISCHARGE:                                   PROGRESS NOTE   SUBJECTIVE:  Hospital day #2.  The patient says she feels a little better.   OBJECTIVE:  VITAL SIGNS:  Temperature 97.8, pulse 65, respirations 20, blood  pressure 150/75, her weight is recorded as 147.7 pounds.  CHEST:  Chest is clear to auscultation bilaterally.  CARDIOVASCULAR SYSTEM:  She has an S4 gallop, regular rhythm.  ABDOMEN:  Abdomen is obese, soft, nontender.  CENTRAL NERVOUS SYSTEM:  Speech difficult but seems to have improved  slightly.   Lab work:  White count remains normal.  Hematocrit stable at 34.  Platelets  remain normal.  Differential remains normal.  Her serum chemistry is  significant for sodium of 139, potassium 4.1, chloride 115, CO2 23, glucose  86, BUN 12, creatinine 0.8.  Liver function tests, alkaline phosphatase is  still slightly elevated.  Total protein 5.8, albumin 3.3 and calcium 8.7.  Random lipid panel revealed an LDL of 104, triglycerides of 117, cholesterol  of 172.   She has been seen by the neurologist who believes she had a subcortical  stroke, and agrees with the current management and is awaiting the results  of the MRI, MRA.  She has been evaluated by the speech therapist who found  that she had no difficulty with regular foods and thin liquids.  She has had  carotid Dopplers which reveal 59% stenosis of the origin of the left  internal carotid artery.  She has had a 2-D echocardiogram, and the result  is pending.   ASSESSMENT:  1.  Acute cerebrovascular accident, MRI pending.  We will continue current      treatment, continue normal saline hydration.  2.  Hypertension, uncontrolled.  Control is  improved, and we will now      increase her medication to improve her control even further.  3.  Hearing impaired, stable.  4.  Dysarthria.  Some slight improvement.   OVERALL PLAN:  Start this patient on physical therapy and plan for discharge  in the morning.  On admission, her situation had been discussed with her  daughter, and it is likely that she will be discharged to her daughter's  home with home health care.     Leop  LC/MEDQ  D:  [id number]  T:  05/09/2004  Job:  [id number]

## 2010-11-07 NOTE — H&P (Signed)
NAMEJASHLEY, Gabrielle Goodwin               ACCOUNT NO.:  [id number]   MEDICAL RECORD NO.:  [id number]          PATIENT TYPE:  INP   LOCATION:  A214                          FACILITY:  [facility]   PHYSICIAN:  Vania Rea, M.D. DATE OF BIRTH:  December 01, 1916   DATE OF ADMISSION:  05/07/2004  DATE OF DISCHARGE:  LH                                HISTORY & PHYSICAL   PRIMARY CARE PHYSICIAN:  Unassigned.   DERMATOLOGIST:  Ria Bush. Tafeen, M.D.   CHIEF COMPLAINT:  Difficulty speaking since this morning.   HISTORY OF PRESENT ILLNESS:  This is an 77 year old, hearing-inmpaired,  Caucasian lady who cannot remember the last time she visited her primary  medical doctor.  She does seen an ophthalmologist for macular degeneration  and glaucoma and she does see a dermatology, Dr. Jorja Loa for basal cell  cancers and other skin disorders; but she feels that she is healthy and does  not get regular medical follow up.  She lives alone; does everything  herself.  Her only daughter does live nearby.   She was in her baseline state of health, had her breakfast, but then at  about 10 o'clock this morning noticed that she had difficulty speaking and  apparently was, also, weak on her left side.  It is not clear if she took  any remedial measures, but seemed to have thought it would go away, but  eventually called her daughter at about 2 o'clock.  Her daughter was in  [place], at that time; and had great difficulty understanding her over  the phone, realized that something was wrong and sent EMS to the house to  check on her.   She was brought to the [facility] Emergency Room by emergency medical  services where an acute neurologic deficit was noted in terms of her speech  and her ability to swallow.  The patient had a CT scan of the head done  which showed calcification in the basal ganglia, but no acute neurologic  deficit. The hospitalist service was called for admission.   The patient denies fever, cough,  or cold.  She denies headaches, chest pain,  shortness of breath, or palpitations. She denies any past history of heart  attacks, diabetes, hypertension, or strokes.  Her daughter says she usually  takes her blood pressure occasionally and a typical blood pressure is  118/80.  However, she has not taken her blood pressure for some time.   PAST MEDICAL HISTORY:  1.  Arthritis.  2.  Macular degeneration.  3.  Glaucoma.  4.  Basal cell carcinoma of her face and back.   MEDICATIONS:  Advil 400 mg daily for arthritic pain.   ALLERGIES:  No known drug allergies.   SOCIAL HISTORY:  Lives alone. She is a retired Animal nutritionist.  She discontinued tobacco use some 20 years ago, but does  have a long history of tobacco use prior to that, unable to quantify the  amount.  No history of alcohol or illicit drug use.   FAMILY HISTORY:  Significant for a father who died of  an MI at age 37, a  mother who died of leukemia at age 21.  She had 1 sister who died of breast  cancer in her 70s and 2 brothers who are healthy in their 25s. She has a  daughter who is 39 years old and in good health and present at this  interview and examination. No family history of strokes.   REVIEW OF SYSTEMS:  On reviewing all 10-points, she denies any significant  features other than noted above; and arthritis is located in her left knee.   PHYSICAL EXAMINATION:  GENERAL:  She is a very pleasant, elderly, Caucasian  lady lying on the stretcher.  She is very hard of hearing and I need to  raise my voice significantly to be heard.  VITAL SIGNS:  Temperature is 97.1, blood pressure on initial presentation  was 206/64 with a pulse of 81.  She has received intravenous labetalol and  currently her blood pressure is 168/74 with a pulse of 75, respirations 20.  She is having no pain.  She is saturating at 98% on 2 liters.  MENTAL STATUS:  She is alert and oriented x3.  HEENT:  Her pupils are round,  equal and reactive to light.  NECK:  She has no lymphadenopathy.  She has no jugular venous distention.  She does have a pulsatile mass at the base of her right neck over which a  bruit is heard.  BREASTS:  There is no nipple deformity. Her breasts are firm.  There are no  masses.  There are no axillary lymph nodes.  CHEST:  Her chest is clear to auscultation bilaterally.  CARDIOVASCULAR:  Regular rhythm with a 1/6 systolic murmur.  Right carotid  bruit is noted.  ABDOMEN:  Her abdomen is obese, soft, and nontender.  There are no masses.  EXTREMITIES:  She has small effusion in the left knee extending into the  suprapatellar area and the left knee is more swollen than the right.  She  has 1+ dorsalis pedis pulses bilaterally and both feet are warm.  CENTRAL NERVOUS SYSTEM:  She is alert and oriented x3.  Cranial nerves:  Her  extraocular movements appear to be intact.  Her facial muscles appear to be  intact without focal weakness.  Her uvula is deviated to the right.  Her gag  reflex is present, but after testing her gag she continues to cough  excessively; it may be because of difficulty swallowing, clearing saliva;  however, she has no drooling from her mouth.  There does not seem to be  peripheral weakness of either sternocleidomastoid.  There is a suggestion of  a mild pronator drift on the right; however, pulse seems to be a grade 5  bilaterally.  Her fingers, on the right side; however, are severely  arthritic.  Her reflexes are 3+ in the upper extremities and 1+ bilaterally  in the lower extremities although very difficult to examine.  Her plantars  seem to be both downgoing then upgoing which may be because she is very  ticklish.  No sensory deficit was noted; and no motor deficit was noted in  the lower extremities.  Her gait was not tested.   LABS:  Her white count is 5.0, hematocrit 36, platelets 269.  She has a normal differential.  Her PT is 13.4, INR is 1.5 and the PTT is  26.  Serum  chemistry:  Sodium 139, potassium 3.6, chloride 111, CO2 21, glucose 117,  BUN 23, creatinine 1.1,  total bilirubin 0.5.  Her alkaline phosphatase was  163, AST 16, ALT 12, total protein 6.1, albumin 3.5, calcium 8.9.  She has  had 1 set of point of care enzymes which were negative.  Her urinalysis  shows a specific gravity of 1.020, otherwise bland.   ASSESSMENT:  1.  Acute neurologic accident.  2.  Hypertension, uncontrolled possibly a reaction to her cerebrovascular      accident, athough she does not have bradycardia.  3.  Dehydration as evidenced by a urinalysis and an elevated BUN and      creatinine ratio.  4.  Elevated alkaline phosphatase of unclear etiology.   PLAN:  1.  Will get an MRI/MRA to evaluate for an acute cardiovascular event, not      showing up on CT scan; calcification in her basal ganglia is somewhat      suspicious and we will get it with and without contrast to be sure there      is no tumor.  2.  We will also institute the stroke protocol including RPR, homocystine,      etcetera.  3.  We will empirically start her on Lipitor and we will get random lipid      panel now.  4.  We will withhold full anticoagulation but give prophylactic      anticoagulation.  5.  Will target her blood pressure to 150/90 as long as she remains      asymptomatic and get a neurologic evaluation.  6.  We will get carotid Dopplers, 2-D echocardiograms, and we will get an      abdominal ultrasound to evaluate her liver in view of the isolated      elevation of alkaline phosphatase; will also get confirmatory      5'neucleosidase.     Leop   LC/MEDQ  D:  05/07/2004  T:  05/07/2004  Job:  [id number]

## 2011-01-12 ENCOUNTER — Other Ambulatory Visit: Payer: Self-pay | Admitting: Family Medicine

## 2011-02-09 ENCOUNTER — Other Ambulatory Visit: Payer: Self-pay | Admitting: Family Medicine

## 2011-02-16 ENCOUNTER — Other Ambulatory Visit: Payer: Self-pay | Admitting: Family Medicine

## 2011-03-16 ENCOUNTER — Other Ambulatory Visit: Payer: Self-pay | Admitting: Family Medicine

## 2011-03-23 ENCOUNTER — Other Ambulatory Visit: Payer: Self-pay | Admitting: Family Medicine

## 2011-03-27 LAB — HEMOGLOBIN AND HEMATOCRIT, BLOOD: Hemoglobin: 7.4 g/dL — CL (ref 12.0–15.0)

## 2011-03-27 LAB — CROSSMATCH
ABO/RH(D): O POS
Antibody Screen: NEGATIVE

## 2011-04-27 ENCOUNTER — Other Ambulatory Visit: Payer: Self-pay | Admitting: Family Medicine

## 2011-04-27 NOTE — Telephone Encounter (Signed)
Please advise 

## 2011-05-04 ENCOUNTER — Other Ambulatory Visit: Payer: Self-pay | Admitting: Family Medicine

## 2011-06-02 ENCOUNTER — Other Ambulatory Visit: Payer: Self-pay | Admitting: Family Medicine

## 2011-06-22 ENCOUNTER — Other Ambulatory Visit: Payer: Self-pay | Admitting: Family Medicine

## 2011-08-10 ENCOUNTER — Other Ambulatory Visit: Payer: Self-pay | Admitting: Family Medicine

## 2011-09-21 ENCOUNTER — Other Ambulatory Visit: Payer: Self-pay | Admitting: Family Medicine

## 2011-09-24 ENCOUNTER — Other Ambulatory Visit: Payer: Self-pay | Admitting: Family Medicine

## 2011-10-05 ENCOUNTER — Other Ambulatory Visit: Payer: Self-pay | Admitting: Family Medicine

## 2012-02-15 ENCOUNTER — Other Ambulatory Visit: Payer: Self-pay | Admitting: Family Medicine

## 2012-04-18 ENCOUNTER — Other Ambulatory Visit: Payer: Self-pay | Admitting: Family Medicine

## 2012-04-20 IMAGING — CR DG KNEE COMPLETE 4+V*R*
4 series · 4 of 4 positions shown · non-contrast
Comparison: Right knee radiographs 08/20/2006

CLINICAL DATA: Fell.  Right knee pain.

RIGHT KNEE - COMPLETE 4+ VIEW

[view not recorded (1 of 4)]
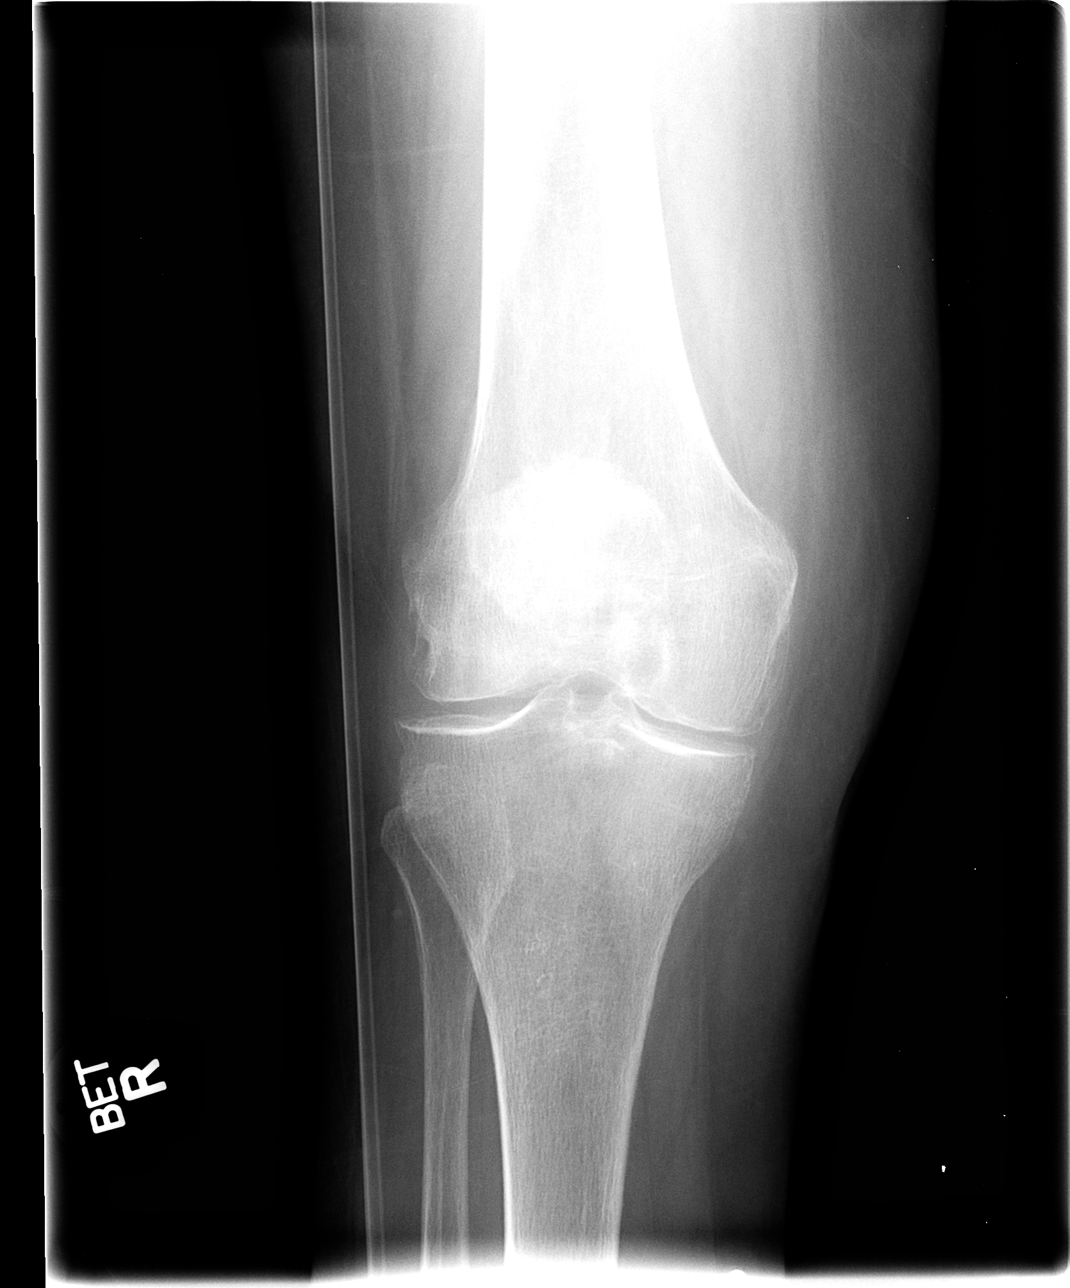

[view not recorded (2 of 4)]
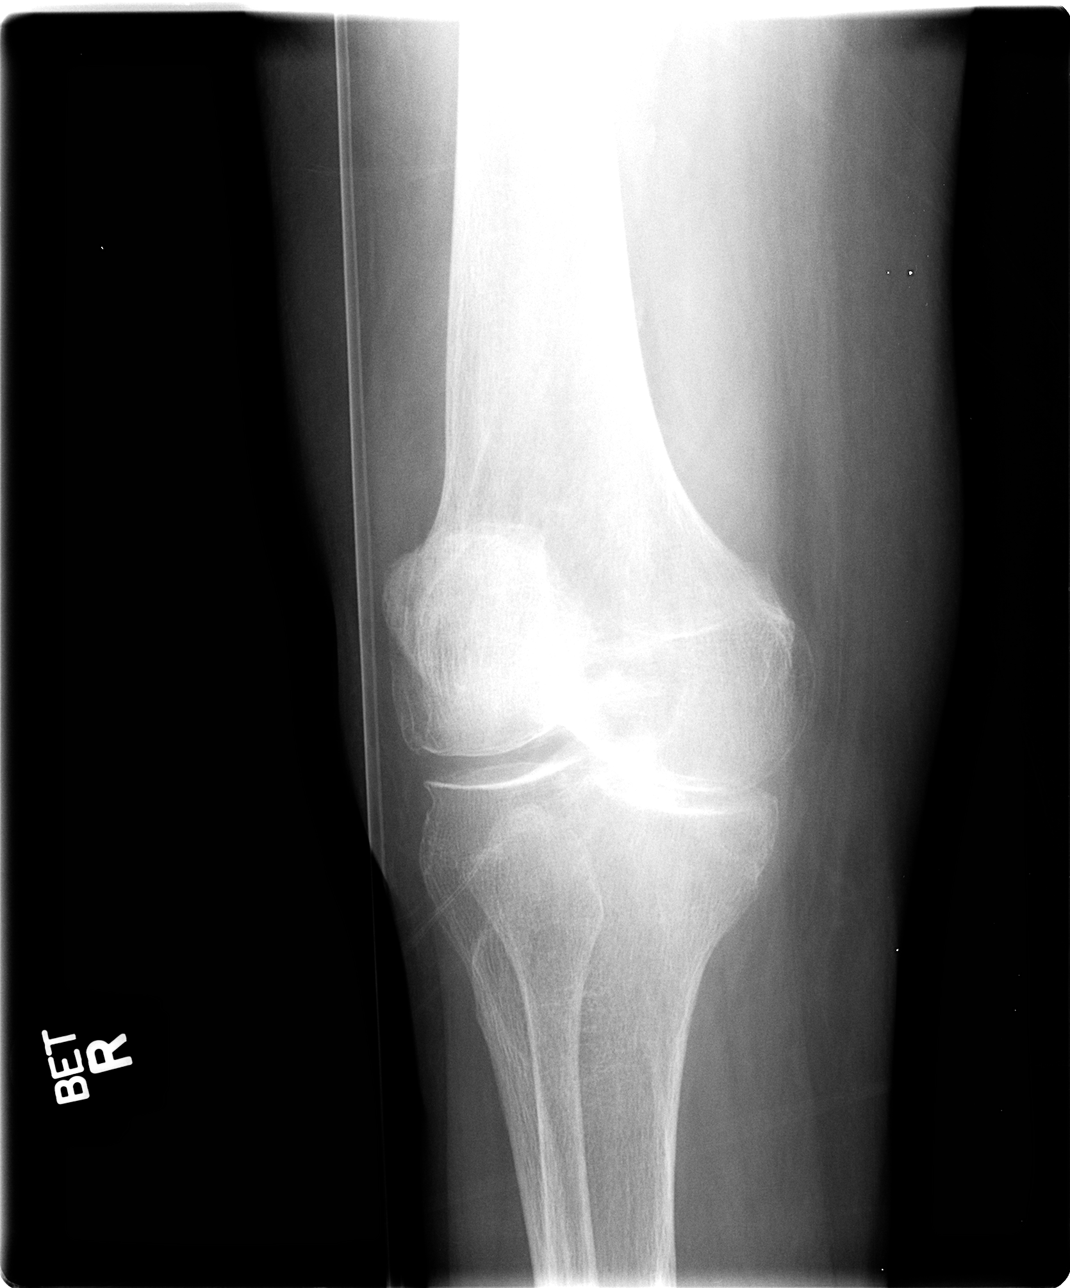

[view not recorded (3 of 4)]
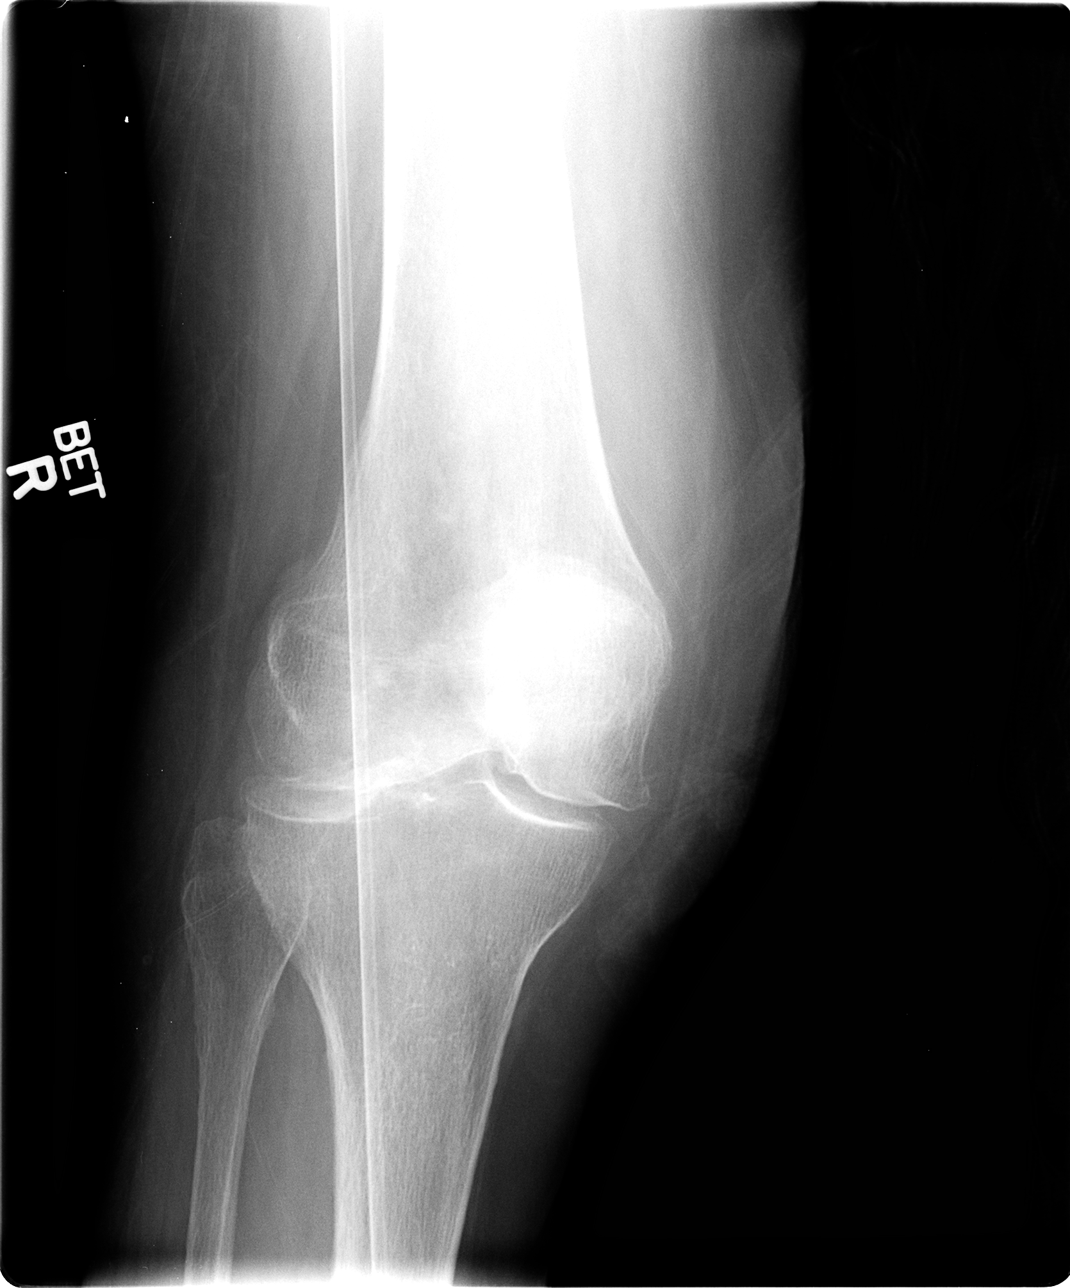

[view not recorded (4 of 4)]
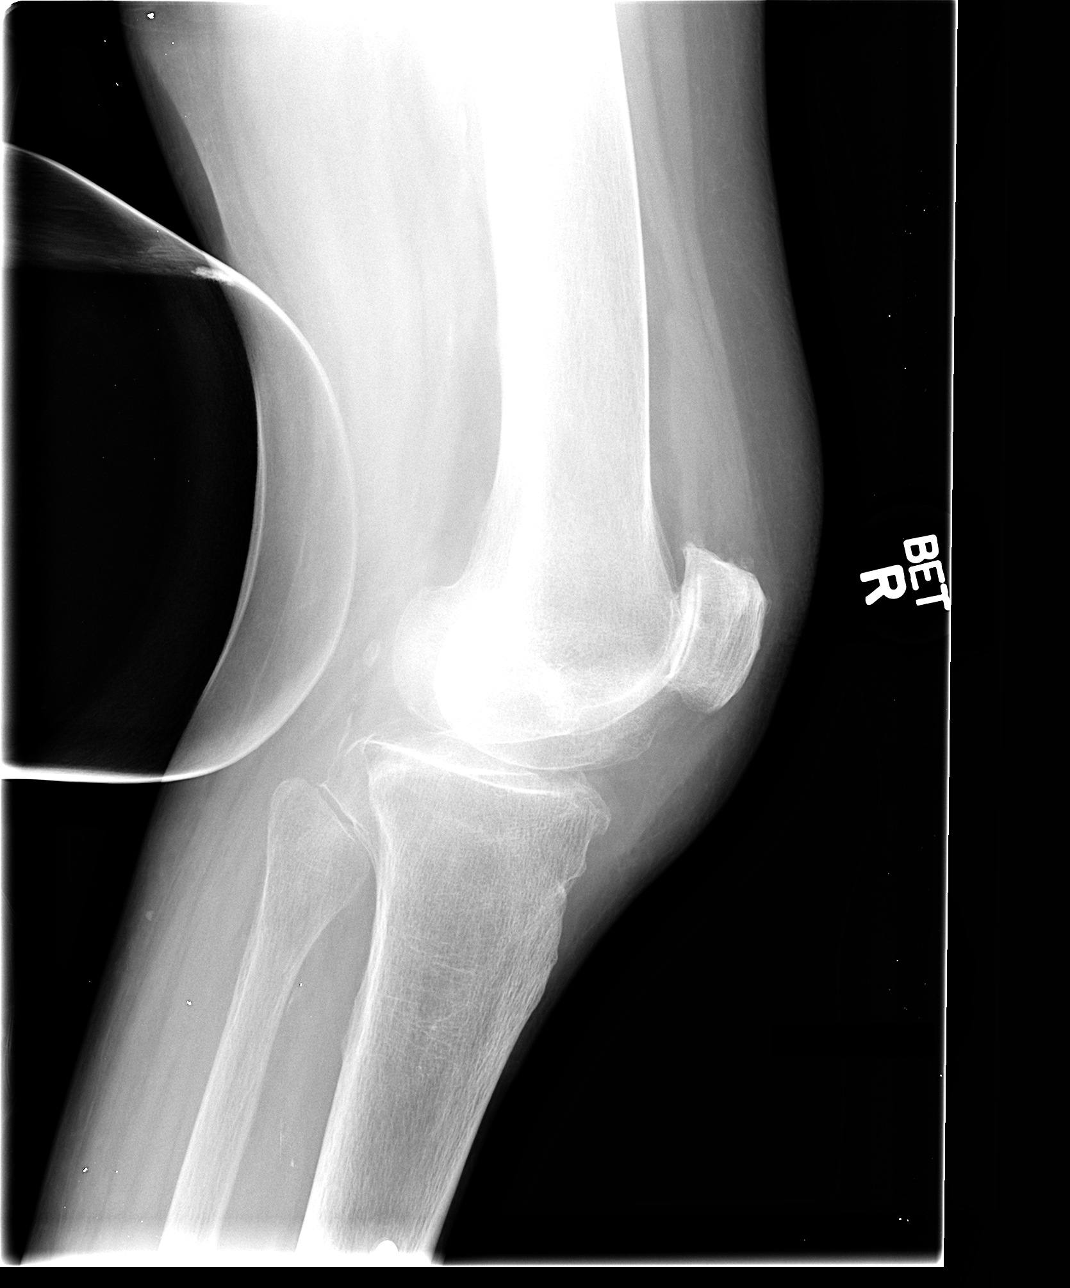

[4 of 4 positions shown; findings below may reference images not displayed]

FINDINGS: There are tricompartmental degenerative changes with
joint space narrowing and osteophytic spurring.  No definite
fractures are seen.  There is a small joint effusion.
IMPRESSION: 1.  Tricompartmental degenerative changes.
2.  Small joint effusion.
3.  No definite acute fracture.

## 2014-04-30 ENCOUNTER — Emergency Department (HOSPITAL_COMMUNITY): Payer: Medicare Other

## 2014-04-30 ENCOUNTER — Emergency Department (HOSPITAL_COMMUNITY)
Admission: EM | Admit: 2014-04-30 | Discharge: 2014-04-30 | Disposition: A | Discharge status: Home / Self Care | Payer: Medicare Other | Attending: Emergency Medicine | Admitting: Emergency Medicine

## 2014-04-30 DIAGNOSIS — R142 Eructation: Secondary | ICD-10-CM | POA: Insufficient documentation

## 2014-04-30 DIAGNOSIS — Z7982 Long term (current) use of aspirin: Secondary | ICD-10-CM | POA: Diagnosis not present

## 2014-04-30 DIAGNOSIS — R109 Unspecified abdominal pain: Secondary | ICD-10-CM | POA: Diagnosis not present

## 2014-04-30 DIAGNOSIS — Z8673 Personal history of transient ischemic attack (TIA), and cerebral infarction without residual deficits: Secondary | ICD-10-CM | POA: Insufficient documentation

## 2014-04-30 DIAGNOSIS — R200 Anesthesia of skin: Secondary | ICD-10-CM | POA: Diagnosis not present

## 2014-04-30 DIAGNOSIS — R42 Dizziness and giddiness: Secondary | ICD-10-CM | POA: Diagnosis not present

## 2014-04-30 DIAGNOSIS — M79605 Pain in left leg: Secondary | ICD-10-CM | POA: Insufficient documentation

## 2014-04-30 DIAGNOSIS — R079 Chest pain, unspecified: Secondary | ICD-10-CM | POA: Insufficient documentation

## 2014-04-30 DIAGNOSIS — M79604 Pain in right leg: Secondary | ICD-10-CM | POA: Insufficient documentation

## 2014-04-30 LAB — CBC WITH DIFFERENTIAL/PLATELET
BASOS ABS: 0.1 10*3/uL (ref 0.0–0.1)
BASOS PCT: 1 % (ref 0–1)
Eosinophils Absolute: 0.1 10*3/uL (ref 0.0–0.7)
Eosinophils Relative: 2 % (ref 0–5)
HCT: 40.2 % (ref 36.0–46.0)
HEMOGLOBIN: 12.9 g/dL (ref 12.0–15.0)
Lymphocytes Relative: 23 % (ref 12–46)
Lymphs Abs: 1.7 10*3/uL (ref 0.7–4.0)
MCH: 31.5 pg (ref 26.0–34.0)
MCHC: 32.1 g/dL (ref 30.0–36.0)
MCV: 98 fL (ref 78.0–100.0)
Monocytes Absolute: 0.4 10*3/uL (ref 0.1–1.0)
Monocytes Relative: 5 % (ref 3–12)
NEUTROS ABS: 5.1 10*3/uL (ref 1.7–7.7)
NEUTROS PCT: 69 % (ref 43–77)
PLATELETS: 301 10*3/uL (ref 150–400)
RBC: 4.1 MIL/uL (ref 3.87–5.11)
RDW: 12.5 % (ref 11.5–15.5)
WBC: 7.4 10*3/uL (ref 4.0–10.5)

## 2014-04-30 LAB — BASIC METABOLIC PANEL
ANION GAP: 10 (ref 5–15)
BUN: 20 mg/dL (ref 6–23)
CO2: 26 mEq/L (ref 19–32)
Calcium: 9.4 mg/dL (ref 8.4–10.5)
Chloride: 101 mEq/L (ref 96–112)
Creatinine, Ser: 1.12 mg/dL — ABNORMAL HIGH (ref 0.50–1.10)
GFR, EST AFRICAN AMERICAN: 47 mL/min — AB (ref >90)
GFR, EST NON AFRICAN AMERICAN: 40 mL/min — AB (ref >90)
Glucose, Bld: 98 mg/dL (ref 70–99)
POTASSIUM: 4.5 meq/L (ref 3.7–5.3)
SODIUM: 137 meq/L (ref 137–147)

## 2014-04-30 LAB — TROPONIN I: Troponin I: <0.3 ng/mL (ref <0.30)

## 2014-04-30 NOTE — ED Notes (Signed)
[facility]  Has a complaint of leg pain and chest pain this morning.  Denies any cp at this time.   Also c/o dizziness which is normal for her and she had her prn meclizine. EMS gave 3 baby aspirin and pt had 1 baby asa at home.  Pt has dementia.

## 2014-04-30 NOTE — ED Notes (Signed)
Pt awake , being assisted with eating meal tray by daughter

## 2014-04-30 NOTE — ED Provider Notes (Signed)
CSN: [id number]     Arrival date & time 04/30/14  1029 History  This chart was scribed for Audree CamelScott T Makaley Storts, MD by Tonye RoyaltyJoshua Chen, ED Scribe. This patient was seen in room APA18/APA18 and the patient's care was started at 11:09 AM.     Chief Complaint  Patient presents with  . Chest Pain    The history is provided by the patient and a relative. No language interpreter was used.    HPI Comments: Gabrielle Goodwin is a 80 y.o. female with history of TIAs who presents to the Emergency Department complaining of chest pain with onset 2 hours prior and lasting 15-20 minutes. History is taken from daughter at bedside as patient is demented. Pt notes associated bilateral leg pain, dizziness, abdominal pain and burping. She notes numbness and pain radiating to arm. Pt has PHx of dizzy spells but denies having similar pains before. Pt can ambulate. Pt takes baby ASA daily and was given Tylenol PTA. She has no PMHx of MI. EMS gave ASA prior to arrival. Patient currently has no complaints.   No past medical history on file. No past surgical history on file. No family history on file. History  Substance Use Topics  . Smoking status: Not on file  . Smokeless tobacco: Not on file  . Alcohol Use: Not on file   OB History    No data available     Review of Systems  Cardiovascular: Positive for chest pain.  Gastrointestinal: Positive for abdominal pain.       Burping  Musculoskeletal:       Leg pain  Neurological: Positive for dizziness and numbness.  All other systems reviewed and are negative.     Allergies  Codeine  Home Medications   Prior to Admission medications   Not on File   BP 178/66 mmHg  Pulse 60  Temp(Src) 97.4 F (36.3 C) (Oral)  Resp 16  Ht 5\' 6"  (1.676 m)  Wt 135 lb (61.236 kg)  BMI 21.80 kg/m2  SpO2 98% Physical Exam  Constitutional: She appears well-developed and well-nourished.  HENT:  Head: Normocephalic and atraumatic.  Nose: Nose normal.  Eyes: EOM are  normal.  Neck: Neck supple.  Cardiovascular: Normal rate, regular rhythm and normal heart sounds.  Exam reveals no friction rub.   No murmur heard. Pulmonary/Chest: Effort normal and breath sounds normal. No respiratory distress. She has no wheezes. She has no rales.  Abdominal: She exhibits no distension. There is no tenderness.  Musculoskeletal: She exhibits no edema or tenderness.  Neurological: She is alert.  Skin: Skin is warm and dry.  Psychiatric: She has a normal mood and affect. Her behavior is normal.  Nursing note and vitals reviewed.   ED Course  Procedures (including critical care time) DIAGNOSTIC STUDIES: Oxygen Saturation is 98% on room air, normal by my interpretation.    COORDINATION OF CARE: 11:15 AM Discussed treatment plan with patient at beside, the patient agrees with the plan and has no further questions at this time.   Labs Review Labs Reviewed  BASIC METABOLIC PANEL - Abnormal; Notable for the following:    Creatinine, Ser 1.12 (*)    GFR calc non Af Amer 40 (*)    GFR calc Af Amer 47 (*)    All other components within normal limits  CBC WITH DIFFERENTIAL  TROPONIN I  TROPONIN I    Imaging Review Dg Chest Portable 1 View  04/30/2014   CLINICAL DATA:  Chest pain.  Initial encounter.  EXAM: PORTABLE CHEST - 1 VIEW  COMPARISON:  02/11/2010  FINDINGS: No definitive cardiomegaly. Aortic contours are distorted from rightward rotation, but have an unchanged appearance compared to 2011. The hila are obscured.  Diffuse interstitial coarsening without overt edema. No consolidation, effusion, or pneumothorax. No osseous findings to explain chest pain.  IMPRESSION: 1. Interstitial coarsening which could be congestive or bronchitic. 2. Limited evaluation of aortic contour due to rotation.   Electronically Signed   By: Tiburcio PeaJonathan  Watts M.D.   On: 04/30/2014 11:03     EKG Interpretation   Date/Time:  Monday April 30 2014 10:40:37 EST Ventricular Rate:  54 PR  Interval:  212 QRS Duration: 91 QT Interval:  452 QTC Calculation: 428 R Axis:   58 Text Interpretation:  Sinus rhythm Borderline prolonged PR interval No  significant change since last tracing Confirmed by Aliza Moret  MD, Kelsea Mousel  ([id number]) on 04/30/2014 11:02:26 AM      MDM   Final diagnoses:  Chest pain    Patient with 15-20 minutes of chest pain concerning for anginal origin. No asymmetric leg swelling or pain to suggest DVT/PE. Pain free for several hours. EKG benign, initial troponin normal. Discussed with family, if no MI they prefer to have patient discharged given she is 89 and given her health would not want any intervention done and no hospital stay. Thus, 2nd troponin drawn and is negative. Will d/c home with return precautions.    Audree CamelScott T Draysen Weygandt, MD 04/30/14 [phone]

## 2014-04-30 NOTE — Discharge Instructions (Signed)

## 2014-05-26 ENCOUNTER — Emergency Department (HOSPITAL_COMMUNITY): Payer: Medicare Other

## 2014-05-26 ENCOUNTER — Emergency Department (HOSPITAL_COMMUNITY)
Admission: EM | Admit: 2014-05-26 | Discharge: 2014-05-26 | Disposition: A | Discharge status: Home / Self Care | Payer: Medicare Other | Attending: Emergency Medicine | Admitting: Emergency Medicine

## 2014-05-26 ENCOUNTER — Encounter (HOSPITAL_COMMUNITY): Payer: Self-pay | Admitting: Emergency Medicine

## 2014-05-26 DIAGNOSIS — S4991XA Unspecified injury of right shoulder and upper arm, initial encounter: Secondary | ICD-10-CM | POA: Diagnosis present

## 2014-05-26 DIAGNOSIS — Z87891 Personal history of nicotine dependence: Secondary | ICD-10-CM | POA: Insufficient documentation

## 2014-05-26 DIAGNOSIS — Z8639 Personal history of other endocrine, nutritional and metabolic disease: Secondary | ICD-10-CM | POA: Insufficient documentation

## 2014-05-26 DIAGNOSIS — Y9289 Other specified places as the place of occurrence of the external cause: Secondary | ICD-10-CM | POA: Diagnosis not present

## 2014-05-26 DIAGNOSIS — Z79899 Other long term (current) drug therapy: Secondary | ICD-10-CM | POA: Insufficient documentation

## 2014-05-26 DIAGNOSIS — W01198A Fall on same level from slipping, tripping and stumbling with subsequent striking against other object, initial encounter: Secondary | ICD-10-CM | POA: Insufficient documentation

## 2014-05-26 DIAGNOSIS — M1990 Unspecified osteoarthritis, unspecified site: Secondary | ICD-10-CM | POA: Diagnosis not present

## 2014-05-26 DIAGNOSIS — Y9389 Activity, other specified: Secondary | ICD-10-CM | POA: Insufficient documentation

## 2014-05-26 DIAGNOSIS — F0390 Unspecified dementia without behavioral disturbance: Secondary | ICD-10-CM | POA: Diagnosis not present

## 2014-05-26 DIAGNOSIS — Z8673 Personal history of transient ischemic attack (TIA), and cerebral infarction without residual deficits: Secondary | ICD-10-CM | POA: Diagnosis not present

## 2014-05-26 DIAGNOSIS — Z7982 Long term (current) use of aspirin: Secondary | ICD-10-CM | POA: Diagnosis not present

## 2014-05-26 DIAGNOSIS — Z8669 Personal history of other diseases of the nervous system and sense organs: Secondary | ICD-10-CM | POA: Insufficient documentation

## 2014-05-26 DIAGNOSIS — Y998 Other external cause status: Secondary | ICD-10-CM | POA: Insufficient documentation

## 2014-05-26 DIAGNOSIS — I10 Essential (primary) hypertension: Secondary | ICD-10-CM | POA: Insufficient documentation

## 2014-05-26 DIAGNOSIS — W19XXXA Unspecified fall, initial encounter: Secondary | ICD-10-CM

## 2014-05-26 DIAGNOSIS — M25511 Pain in right shoulder: Secondary | ICD-10-CM

## 2014-05-26 HISTORY — DX: Essential (primary) hypertension: I10

## 2014-05-26 HISTORY — DX: Unspecified osteoarthritis, unspecified site: M19.90

## 2014-05-26 HISTORY — DX: Thyrotoxicosis, unspecified without thyrotoxic crisis or storm: E05.90

## 2014-05-26 HISTORY — DX: Cerebral infarction, unspecified: I63.9

## 2014-05-26 HISTORY — DX: Unspecified dementia, unspecified severity, without behavioral disturbance, psychotic disturbance, mood disturbance, and anxiety: F03.90

## 2014-05-26 HISTORY — DX: Unspecified macular degeneration: H35.30

## 2014-05-26 NOTE — ED Notes (Signed)
Family at bedside. 

## 2014-05-26 NOTE — ED Notes (Signed)
Assisted patient to use bedpan. Patient had on pull up was wet.

## 2014-05-26 NOTE — ED Notes (Signed)
Caregiver reports giving 2 Tylenol to pt at 1530.

## 2014-05-26 NOTE — ED Notes (Signed)
Pt caregiver reports pt fell from seated position on cough to floor. Pt hit right shoulder and right side of head. Denies loc. Pt c/o right shoulder pain.

## 2014-05-26 NOTE — ED Provider Notes (Signed)
CSN: [id number]     Arrival date & time 05/26/14  1646 History   First MD Initiated Contact with Patient 05/26/14 1723     Chief Complaint  Patient presents with  . Fall     (Consider location/radiation/quality/duration/timing/severity/associated sxs/prior Treatment) HPI..... Level V caveat for urgent need for intervention.   Accidental fall from couch to floor. Complains of right shoulder pain. No head or neck trauma. No behavioral change. No truncal pain  Past Medical History  Diagnosis Date  . Stroke   . Hypertension   . Dementia   . Hyperthyroidism   . Arthritis   . Macular degeneration    Past Surgical History  Procedure Laterality Date  . Tonsillectomy    . Appendectomy     No family history on file. History  Substance Use Topics  . Smoking status: Former Games developermoker  . Smokeless tobacco: Not on file  . Alcohol Use: No   OB History    No data available     Review of Systems  Unable to perform ROS     Allergies  Codeine  Home Medications   Prior to Admission medications   Medication Sig Start Date End Date Taking? Authorizing Provider  acetaminophen (TYLENOL) 500 MG tablet Take 1,000 mg by mouth every 6 (six) hours as needed for mild pain.   Yes Historical Provider, MD  aspirin EC 81 MG tablet Take 81 mg by mouth daily.   Yes Historical Provider, MD  donepezil (ARICEPT) 10 MG tablet Take 10 mg by mouth at bedtime.   Yes Historical Provider, MD  ferrous sulfate 325 (65 FE) MG tablet Take 325 mg by mouth at bedtime.   Yes Historical Provider, MD  guaiFENesin (MUCINEX) 600 MG 12 hr tablet Take 600 mg by mouth every 12 (twelve) hours as needed for cough Marland Kitchen(/congestion).   Yes Historical Provider, MD  hydroxypropyl methylcellulose / hypromellose (ISOPTO TEARS / GONIOVISC) 2.5 % ophthalmic solution Place 1 drop into both eyes 2 (two) times daily.   Yes Historical Provider, MD  Melatonin 3 MG TABS Take 1 tablet by mouth at bedtime.   Yes Historical Provider, MD   methimazole (TAPAZOLE) 5 MG tablet Take 5 mg by mouth daily.   Yes Historical Provider, MD  Multiple Vitamins-Minerals (PRESERVISION AREDS PO) Take 1 capsule by mouth 2 (two) times daily.   Yes Historical Provider, MD  sennosides-docusate sodium (SENOKOT-S) 8.6-50 MG tablet Take 2 tablets by mouth at bedtime.   Yes Historical Provider, MD  timolol (TIMOPTIC) 0.5 % ophthalmic solution Place 1 drop into both eyes 2 (two) times daily.   Yes Historical Provider, MD  albuterol (PROVENTIL) (2.5 MG/3ML) 0.083% nebulizer solution Take 2.5 mg by nebulization every 4 (four) hours as needed for wheezing or shortness of breath.    Historical Provider, MD  loperamide (IMODIUM) 2 MG capsule Take 2 mg by mouth as needed for diarrhea or loose stools.    Historical Provider, MD  meclizine (ANTIVERT) 25 MG tablet Take 25 mg by mouth every 6 (six) hours as needed for dizziness.    Historical Provider, MD  neomycin-bacitracin-polymyxin (NEOSPORIN) ointment Apply 1 application topically 2 (two) times daily as needed (to prevent infection). apply to eye    Historical Provider, MD   BP 187/87 mmHg  Pulse 72  Temp(Src) 97.3 F (36.3 C) (Oral)  Resp 18  Ht 5\' 6"  (1.676 m)  Wt 127 lb (57.607 kg)  BMI 20.51 kg/m2  SpO2 100% Physical Exam  Constitutional: She appears well-developed  and well-nourished.  Demented  HENT:  Head: Normocephalic and atraumatic.  Eyes: Conjunctivae and EOM are normal. Pupils are equal, round, and reactive to light.  Neck: Normal range of motion. Neck supple.  Cardiovascular: Normal rate, regular rhythm and normal heart sounds.   Pulmonary/Chest: Effort normal and breath sounds normal.  Abdominal: Soft. Bowel sounds are normal.  Musculoskeletal:  Tender. Right shoulder  Neurological: She is alert.  Skin: Skin is warm and dry.  Psychiatric:  demented  Nursing note and vitals reviewed.   ED Course  Procedures (including critical care time) Labs Review Labs Reviewed - No data to  display  Imaging Review Dg Humerus Right  05/26/2014   CLINICAL DATA:  80 year old female with history of trauma from a fall complaining of pain in the right shoulder.  EXAM: RIGHT HUMERUS - 2+ VIEW  COMPARISON:  No priors.  FINDINGS: Three views of the right humerus demonstrate no acute displaced fracture. Soft tissues are unremarkable. Degenerative changes of osteoarthritis are noted in the glenohumeral and acromioclavicular joints. Mild superior subluxation of the humeral head, presumably related to underlying rotator cuff pathology.  IMPRESSION: 1. No acute radiographic abnormality of the right humerus.   Electronically Signed   By: Trudie Reedaniel  Entrikin M.D.   On: 05/26/2014 18:34     EKG Interpretation None      MDM   Final diagnoses:  Right shoulder pain  Fall, initial encounter    Plain films of right humerus are negative. No other bony injuries. Caregiver reports normal behavior    Donnetta HutchingBrian Ronold Hardgrove, MD 05/26/14 2015

## 2014-05-26 NOTE — Discharge Instructions (Signed)
X-ray showed no fracture. Ice. Tylenol.

## 2014-05-26 NOTE — ED Notes (Signed)
Up in chair, dressed by family, ready for discharge. Pt states no pain at rest, "quite a bit" when moving arm. Caregiver given icepack to go. Pt declined sling at this time, but caregiver took it with them

## 2015-10-21 DEATH — deceased
